# Patient Record
Sex: Male | Born: 1975 | Race: White | Hispanic: No | Marital: Single | Smoking: Never smoker
Health system: Southern US, Community
[De-identification: ages and names within clinical notes are randomized; demographics above are authoritative.]

---

## 2014-10-16 ENCOUNTER — Emergency Department (HOSPITAL_COMMUNITY)
Admission: EM | Admit: 2014-10-16 | Discharge: 2014-10-16 | Disposition: A | Discharge status: Home / Self Care | Payer: BLUE CROSS/BLUE SHIELD | Attending: Emergency Medicine | Admitting: Emergency Medicine

## 2014-10-16 ENCOUNTER — Emergency Department (HOSPITAL_COMMUNITY): Payer: BLUE CROSS/BLUE SHIELD

## 2014-10-16 ENCOUNTER — Encounter (HOSPITAL_COMMUNITY): Payer: Self-pay | Admitting: Emergency Medicine

## 2014-10-16 DIAGNOSIS — S53402A Unspecified sprain of left elbow, initial encounter: Secondary | ICD-10-CM | POA: Diagnosis not present

## 2014-10-16 DIAGNOSIS — Y92091 Bathroom in other non-institutional residence as the place of occurrence of the external cause: Secondary | ICD-10-CM | POA: Diagnosis not present

## 2014-10-16 DIAGNOSIS — S63502A Unspecified sprain of left wrist, initial encounter: Secondary | ICD-10-CM | POA: Diagnosis not present

## 2014-10-16 DIAGNOSIS — Y9389 Activity, other specified: Secondary | ICD-10-CM | POA: Insufficient documentation

## 2014-10-16 DIAGNOSIS — Z79899 Other long term (current) drug therapy: Secondary | ICD-10-CM | POA: Diagnosis not present

## 2014-10-16 DIAGNOSIS — W1839XA Other fall on same level, initial encounter: Secondary | ICD-10-CM | POA: Diagnosis not present

## 2014-10-16 DIAGNOSIS — Y998 Other external cause status: Secondary | ICD-10-CM | POA: Diagnosis not present

## 2014-10-16 DIAGNOSIS — R52 Pain, unspecified: Secondary | ICD-10-CM

## 2014-10-16 DIAGNOSIS — S6992XA Unspecified injury of left wrist, hand and finger(s), initial encounter: Secondary | ICD-10-CM | POA: Diagnosis present

## 2014-10-16 MED ORDER — ONDANSETRON HCL 4 MG PO TABS: 4.0000 mg | ORAL_TABLET | Freq: Four times a day (QID) | ORAL | Status: AC

## 2014-10-16 MED ORDER — MORPHINE SULFATE 4 MG/ML IJ SOLN
4.0000 mg | Freq: Once | INTRAMUSCULAR | Status: AC
  Administered 2014-10-16: 4 mg via INTRAVENOUS
  Filled 2014-10-16: qty 1

## 2014-10-16 MED ORDER — IBUPROFEN 600 MG PO TABS: 600.0000 mg | ORAL_TABLET | Freq: Four times a day (QID) | ORAL | Status: AC | PRN

## 2014-10-16 MED ORDER — ONDANSETRON HCL 4 MG/2ML IJ SOLN
4.0000 mg | Freq: Once | INTRAMUSCULAR | Status: AC
  Administered 2014-10-16: 4 mg via INTRAVENOUS
  Filled 2014-10-16: qty 2

## 2014-10-16 MED ORDER — HYDROCODONE-ACETAMINOPHEN 5-325 MG PO TABS: 1.0000 | ORAL_TABLET | ORAL | Status: AC | PRN

## 2014-10-16 MED ORDER — SODIUM CHLORIDE 0.9 % IV SOLN
INTRAVENOUS | Status: DC
  Administered 2014-10-16: 18:00:00 via INTRAVENOUS

## 2014-10-16 NOTE — ED Notes (Signed)
Pt here with c/o of fall on bathroom floor. Wrist and elbow injury. Pain 10/10.

## 2014-10-16 NOTE — ED Provider Notes (Signed)
CSN: [id number]     Arrival date & time 10/16/14  1712 History   None    Chief Complaint  Patient presents with  . Wrist Pain  . Joint Swelling     (Consider location/radiation/quality/duration/timing/severity/associated sxs/prior Treatment) HPI   Patient presents to the ER with complaints of fall on the bathroom floor. He is complaining of severe elbow and wrist injury to the LEFT side and being unable to move his arm due to it "being deformed and broken". He is RIGHT handed He denies loc, head or neck injury. No injury to skin or obvious deformity.   History reviewed. No pertinent past medical history. History reviewed. No pertinent past surgical history. History reviewed. No pertinent family history. History  Substance Use Topics  . Smoking status: Never Smoker   . Smokeless tobacco: Not on file  . Alcohol Use: No    Review of Systems 10 Systems reviewed and are negative for acute change except as noted in the HPI.    Allergies  Review of patient's allergies indicates not on file.  Home Medications   Prior to Admission medications   Medication Sig Start Date End Date Taking? Authorizing Provider  HYDROcodone-acetaminophen (NORCO/VICODIN) 5-325 MG per tablet Take 1-2 tablets by mouth every 4 (four) hours as needed. 10/16/14   Janira Mandell Irine SealG Atha Mcbain, PA-C  ibuprofen (ADVIL,MOTRIN) 600 MG tablet Take 1 tablet (600 mg total) by mouth every 6 (six) hours as needed. 10/16/14   Mansour Balboa Irine SealG Choua Chalker, PA-C  ondansetron (ZOFRAN) 4 MG tablet Take 1 tablet (4 mg total) by mouth every 6 (six) hours. 10/16/14   Cristal Howatt Irine SealG Simmie Garin, PA-C   BP 140/65 mmHg  Pulse 111  Temp(Src) 98.6 F (37 C) (Oral)  Resp 20  SpO2 99% Physical Exam  Constitutional: He appears well-developed and well-nourished. No distress.  HENT:  Head: Normocephalic and atraumatic.  Eyes: Pupils are equal, round, and reactive to light.  Neck: Normal range of motion. Neck supple. No spinous process tenderness and no muscular  tenderness present.  Cardiovascular: Normal rate and regular rhythm.   Pulmonary/Chest: Effort normal.  Abdominal: Soft.  Musculoskeletal:       Left wrist: He exhibits decreased range of motion, tenderness, bony tenderness and swelling. He exhibits no effusion, no crepitus, no deformity and no laceration.       Left forearm: He exhibits tenderness and bony tenderness. He exhibits no swelling, no edema, no deformity and no laceration.  Neurological: He is alert.  Skin: Skin is warm and dry.  Nursing note and vitals reviewed.   ED Course  Procedures (including critical care time) Labs Review Labs Reviewed - No data to display  Imaging Review Dg Elbow Complete Left  10/16/2014   CLINICAL DATA:  Fall wall in inguinal of shower with left elbow pain, initial encounter  EXAM: LEFT ELBOW - COMPLETE 3+ VIEW  COMPARISON:  None.  FINDINGS: A well corticated bony density is noted adjacent to the medial aspect of the elbow joint which may be related to prior trauma or ligamentous calcification. No acute fracture or dislocation is seen. No joint effusion is noted  IMPRESSION: Chronic changes without acute abnormality.   Electronically Signed   By: Alcide CleverMark  Lukens M.D.   On: 10/16/2014 18:18   Dg Forearm Left  10/16/2014   CLINICAL DATA:  Fall in shower with arm pain, initial encounter  EXAM: LEFT FOREARM - 2 VIEW  COMPARISON:  None.  FINDINGS: Chronic changes about the left elbow joint are seen. No  acute fracture or dislocation is noted. Some mild irregularity of the distal radius is seen which may be related to prior trauma. No acute fracture is seen.  IMPRESSION: No acute abnormality noted.   Electronically Signed   By: Alcide CleverMark  Lukens M.D.   On: 10/16/2014 18:20   Dg Wrist Complete Left  10/16/2014   CLINICAL DATA:  Fall in shower, pain, initial encounter  EXAM: LEFT WRIST - COMPLETE 3+ VIEW  COMPARISON:  None.  FINDINGS: There is no evidence of fracture or dislocation. There is no evidence of  arthropathy or other focal bone abnormality. Soft tissues are unremarkable.  IMPRESSION: No acute abnormality noted.   Electronically Signed   By: Alcide CleverMark  Lukens M.D.   On: 10/16/2014 18:19     MDM   Final diagnoses:  Pain  Wrist sprain, left, initial encounter  Elbow sprain, left, initial encounter    The xray shows no fracture physical exam is not significant but the patient still does not want to move his left arm. Will put in ace wrap and foam arm sling, will refer to HAND specialist. RICE protocol recommended  Patient to see Dr. Darin EngelsWingold in 1-2 weeks.  38 y.o.Adarrius Weigelt's evaluation in the Emergency Department is complete. It has been determined that no acute conditions requiring further emergency intervention are present at this time. The patient/guardian have been advised of the diagnosis and plan. We have discussed signs and symptoms that warrant return to the ED, such as changes or worsening in symptoms.  Vital signs are stable at discharge. Filed Vitals:   10/16/14 1741  BP: 140/65  Pulse: 111  Temp: 98.6 F (37 C)  Resp: 20    Patient/guardian has voiced understanding and agreed to follow-up with the PCP or specialist.       Dorthula Matasiffany G Breaker Springer, PA-C 10/16/14 1856  Linwood DibblesJon Knapp, MD 10/18/14 1036

## 2014-10-16 NOTE — Discharge Instructions (Signed)
Sprain A sprain is a tear in one of the strong, fibrous tissues that connect your bones (ligaments). The severity of the sprain depends on how much of the ligament is torn. The tear can be either partial or complete. CAUSES  Often, sprains are a result of a fall or an injury. The force of the impact causes the fibers of your ligament to stretch beyond their normal length. This excess tension causes the fibers of your ligament to tear. SYMPTOMS  You may have some loss of motion or increased pain within your normal range of motion. Other symptoms include:  Bruising.  Tenderness.  Swelling. DIAGNOSIS  In order to diagnose a sprain, your caregiver will physically examine you to determine how torn the ligament is. Your caregiver may also suggest an X-ray exam to make sure no bones are broken. TREATMENT  If your ligament is only partially torn, treatment usually involves keeping the injured area in a fixed position (immobilization) for a short period. To do this, your caregiver will apply a bandage, cast, or splint to keep the area from moving until it heals. For a partially torn ligament, the healing process usually takes 2 to 3 weeks. If your ligament is completely torn, you may need surgery to reconnect the ligament to the bone or to reconstruct the ligament. After surgery, a cast or splint may be applied and will need to stay on for 4 to 6 weeks while your ligament heals. HOME CARE INSTRUCTIONS  Keep the injured area elevated to decrease swelling.  To ease pain and swelling, apply ice to your joint twice a day, for 2 to 3 days.  Put ice in a plastic bag.  Place a towel between your skin and the bag.  Leave the ice on for 15 minutes.  Only take over-the-counter or prescription medicine for pain as directed by your caregiver.  Do not leave the injured area unprotected until pain and stiffness go away (usually 3 to 4 weeks).  Do not allow your cast or splint to get wet. Cover your cast or  splint with a plastic bag when you shower or bathe. Do not swim.  Your caregiver may suggest exercises for you to do during your recovery to prevent or limit permanent stiffness. SEEK IMMEDIATE MEDICAL CARE IF:  Your cast or splint becomes damaged.  Your pain becomes worse. MAKE SURE YOU:  Understand these instructions.  Will watch your condition.  Will get help right away if you are not doing well or get worse. Document Released: 10/04/2000 Document Revised: 12/30/2011 Document Reviewed: 10/19/2011 [facility] CenterExitCare Patient Information 2015 ReserveExitCare, [place]LLC. This information is not intended to replace advice given to you by your health care provider. Make sure you discuss any questions you have with your health care provider.  Wrist Pain Wrist injuries are frequent in adults and children. A sprain is an injury to the ligaments that hold your bones together. A strain is an injury to muscle or muscle cord-like structures (tendons) from stretching or pulling. Generally, when wrists are moderately tender to touch following a fall or injury, a break in the bone (fracture) may be present. Most wrist sprains or strains are better in 3 to 5 days, but complete healing may take several weeks. HOME CARE INSTRUCTIONS   Put ice on the injured area.  Put ice in a plastic bag.  Place a towel between your skin and the bag.  Leave the ice on for 15-20 minutes, 3-4 times a day, for the first 2  days, or as directed by your health care provider.  Keep your arm raised above the level of your heart whenever possible to reduce swelling and pain.  Rest the injured area for at least 48 hours or as directed by your health care provider.  If a splint or elastic bandage has been applied, use it for as long as directed by your health care provider or until seen by a health care provider for a follow-up exam.  Only take over-the-counter or prescription medicines for pain, discomfort, or fever as directed by your health  care provider.  Keep all follow-up appointments. You may need to follow up with a specialist or have follow-up X-rays. Improvement in pain level is not a guarantee that you did not fracture a bone in your wrist. The only way to determine whether or not you have a broken bone is by X-ray. SEEK IMMEDIATE MEDICAL CARE IF:   Your fingers are swollen, very red, white, or cold and blue.  Your fingers are numb or tingling.  You have increasing pain.  You have difficulty moving your fingers. MAKE SURE YOU: Acetaminophen biphasic or extended-release tablets Qu es este medicamento? El ACETAMINOFENO es un analgsico. Se utiliza para tratar los dolores leves y para Personal assistant fiebre. Este medicamento puede ser utilizado para otros usos; si tiene alguna pregunta consulte con su proveedor de atencin mdica o con su farmacutico. MARCAS COMERCIALES DISPONIBLES: Mapap Arthritis Pain, Tylenol 8 Hour, Tylenol Arthritis Pain Qu le debo informar a mi profesional de la salud antes de tomar este medicamento? Necesita saber si usted presenta alguno de los siguientes problemas o situaciones: -si consume alcohol con frecuencia -enfermedad heptica -una reaccin alrgica o inusual al acetaminofeno, a otros medicamentos, alimentos, colorantes o conservantes -si est embarazada o buscando quedar embarazada -si est amamantando a un beb Cmo debo utilizar este medicamento? Tome este medicamento por va oral con un vaso de agua. Siga las instrucciones de la etiqueta o del envase del medicamento. No corte, triture ni [employer]. Tome sus dosis a intervalos regulares. No tome su medicamento con una frecuencia mayor a la indicada. Hable con su pediatra para informarse acerca del uso de este medicamento en nios. Aunque este medicamento ha sido recetado a nios tan menores como de 12 aos de edad para condiciones selectivas, las precauciones se aplican. Sobredosis: Pngase en contacto inmediatamente con  un centro toxicolgico o una sala de urgencia si usted cree que haya tomado demasiado medicamento. ATENCIN: [employer] es solo para usted. No comparta este medicamento con nadie. Qu sucede si me olvido de una dosis? Si olvida una dosis, tmela lo antes posible. Si es casi la hora de la prxima dosis, tome slo esa dosis. No tome dosis adicionales o dobles. Qu puede interactuar con este medicamento? -alcohol -imatinib -isoniazida -otros medicamentos con acetaminofeno Puede ser que esta lista no menciona todas las posibles interacciones. Informe a su profesional de [employer] de [employer] productos a base de hierbas, medicamentos de [place] o suplementos nutritivos que est tomando. Si usted fuma, consume bebidas alcohlicas o si utiliza drogas ilegales, indqueselo tambin a su profesional de [employer]. Algunas sustancias pueden interactuar con su medicamento. A qu debo estar atento al usar [employer]? Informe a su mdico o su profesional de la salud si el dolor persiste durante ms de 10 das ([address]  [address] para los nios), si empeora o si experimenta un dolor nuevo o de tipo diferente. Adems, consulte con su mdico si la fiebre  persiste durante ms de 3 das. No tome otros medicamentos que contienen acetaminofeno con PPL Corporationeste medicamento. Lea siempre las etiquetas cuidadosamente. Si tiene preguntas, consulte a su mdico o su farmacutico. Si toma demasiado acetaminofeno consigue ayuda mdica enseguida. Tomando mucho acetaminofeno puede ser muy peligroso y causar dao al hgado. Aun si no tiene sntomas, es importante que usted consigas ayuda enseguida. Qu efectos secundarios puedo tener al Boston Scientificutilizar este medicamento? Efectos secundarios que debe informar a su mdico o a Producer, television/film/videosu profesional de la salud tan pronto como sea posible: -Therapist, artreacciones alrgicas como erupcin cutnea, picazn o urticarias, hinchazn de la cara, labios o lengua -problemas respiratorios -fiebre o dolor de  garganta -enrojecimiento, formacin de ampollas, descamacin o distensin de la piel, inclusive dentro de la boca -dificultad para orinar o cambios en el volumen de orina -sangrado o magulladuras inusuales -cansancio o debilidad inusual -color amarillento de ojos o piel Efectos secundarios que, por lo general, no requieren atencin mdica (debe informarlos a su mdico o a su profesional de la salud si persisten o si son molestos): -dolor de cabeza -nuseas, vmito Puede ser que Massachusetts Mutual Lifeesta lista no menciona todos los posibles efectos secundarios. Comunquese a su mdico por asesoramiento mdico Hewlett-Packardsobre los efectos secundarios. Usted puede informar los efectos secundarios a la FDA por telfono al 1-800-FDA-1088. Dnde debo guardar mi medicina? Mantngala fuera del alcance de los nios. Gurdela a Sanmina-SCItemperatura ambiente, entre 20 y 25 grados C ([phone] y 4577 grados F). Protjala de la humedad y del Airline pilotcalor. Deseche todo el medicamento que no haya utilizado, despus de la fecha de vencimiento. ATENCIN: Este folleto es un resumen. Puede ser que no cubra toda la posible informacin. Si usted tiene preguntas acerca de esta medicina, consulte con su mdico, su farmacutico o su profesional de Radiographer, therapeuticla salud.  2015, Elsevier/Gold Standard. (2013-06-16 18:23:19)   Understand these instructions.  Will watch your condition.  Will get help right away if you are not doing well or get worse. Document Released: 07/17/2005 Document Revised: 10/12/2013 Document Reviewed: 11/28/2010 [facility] LLCExitCare Patient Information 2015 ProspectExitCare, MarylandLLC. This information is not intended to replace advice given to you by your health care provider. Make sure you discuss any questions you have with your health care provider.

## 2016-04-30 IMAGING — CR DG WRIST COMPLETE 3+V*L*
4 series · 4 of 4 positions shown · non-contrast
Comparison: None.

CLINICAL DATA: Fall in shower, pain, initial encounter

EXAM:
LEFT WRIST - COMPLETE 3+ VIEW

[x wrist lat left]
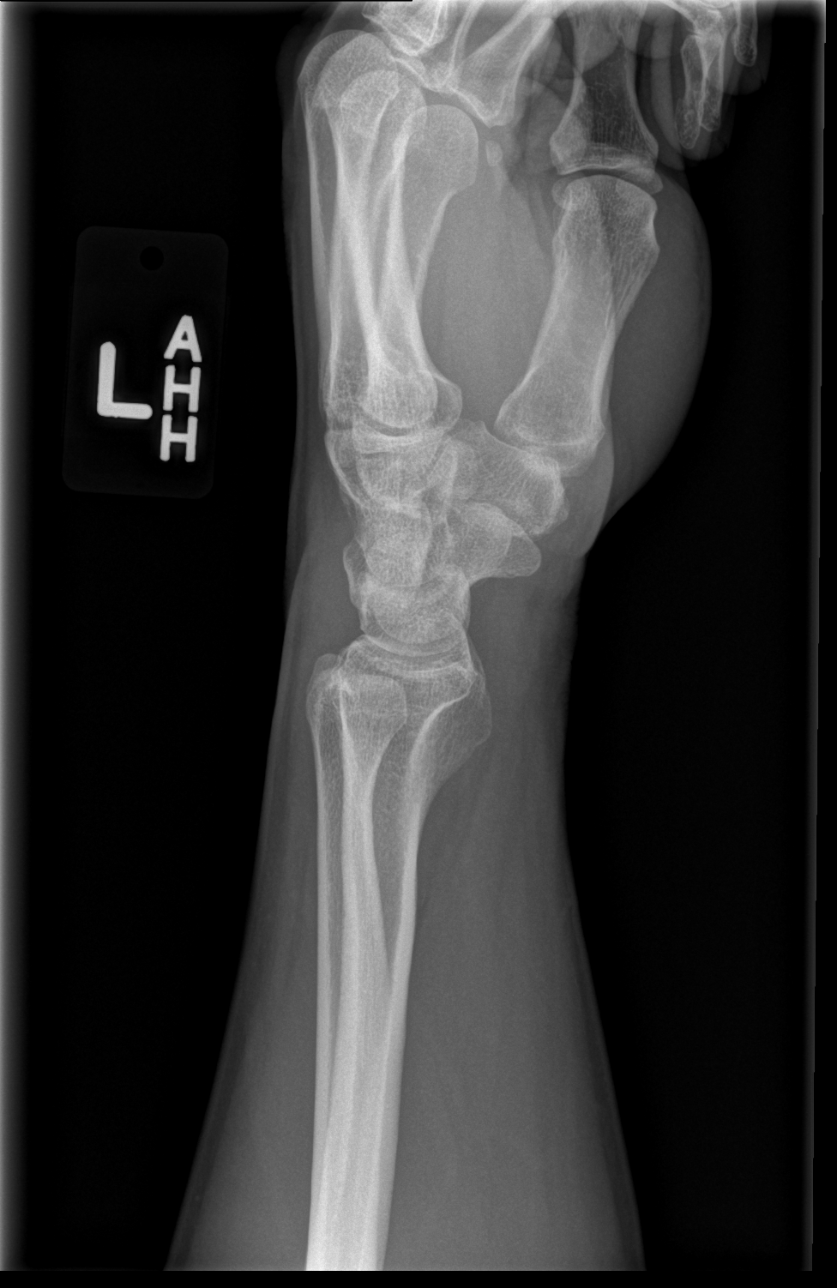

[x wrist obl left]
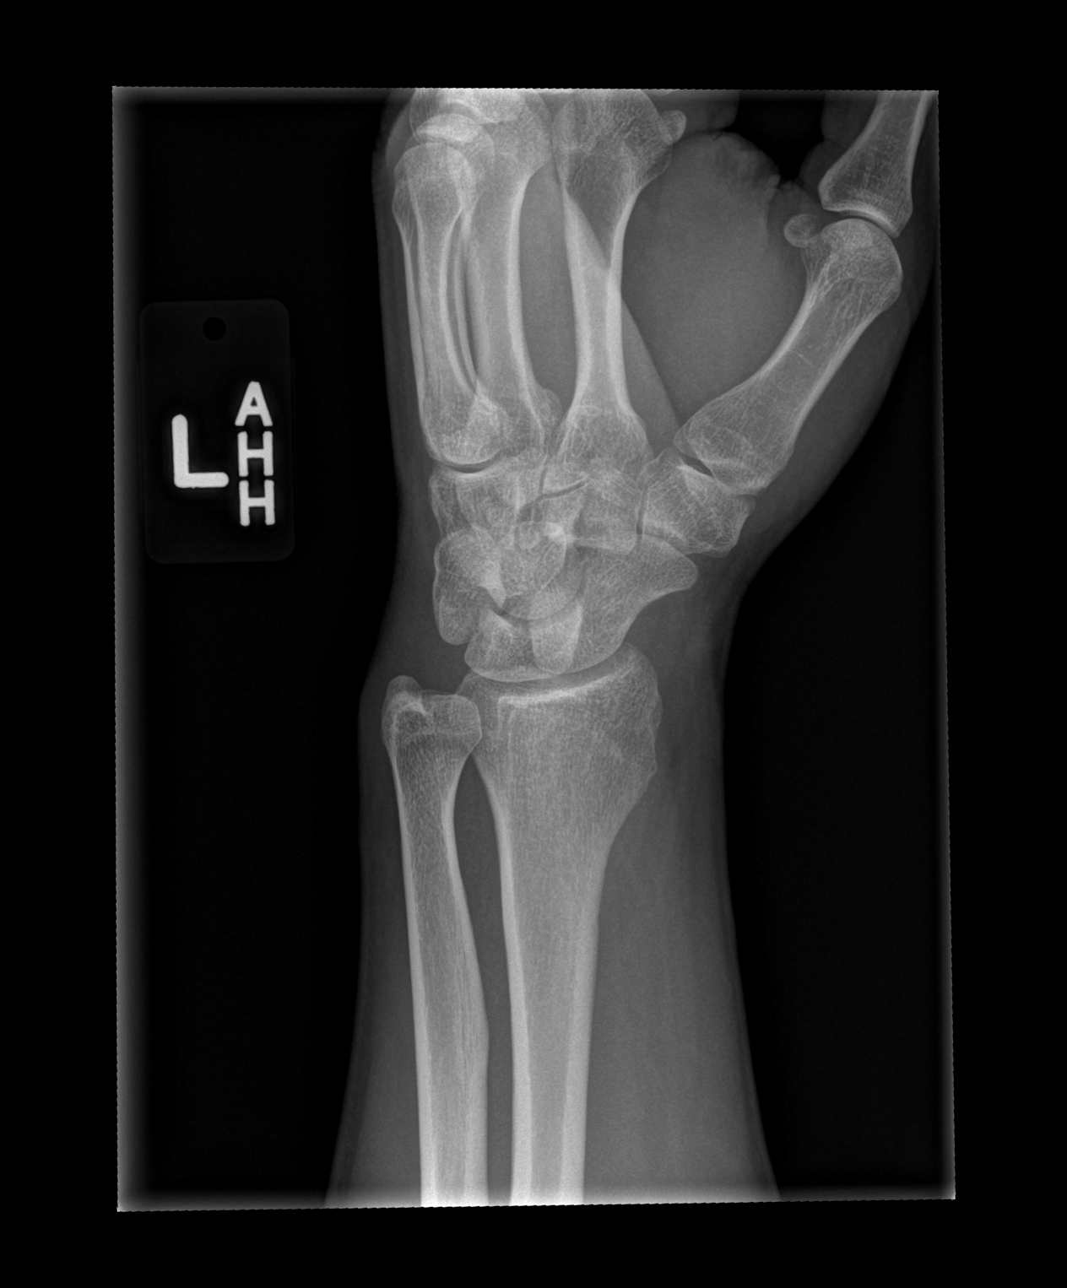

[x wrist navicular view left (1 of 2)]
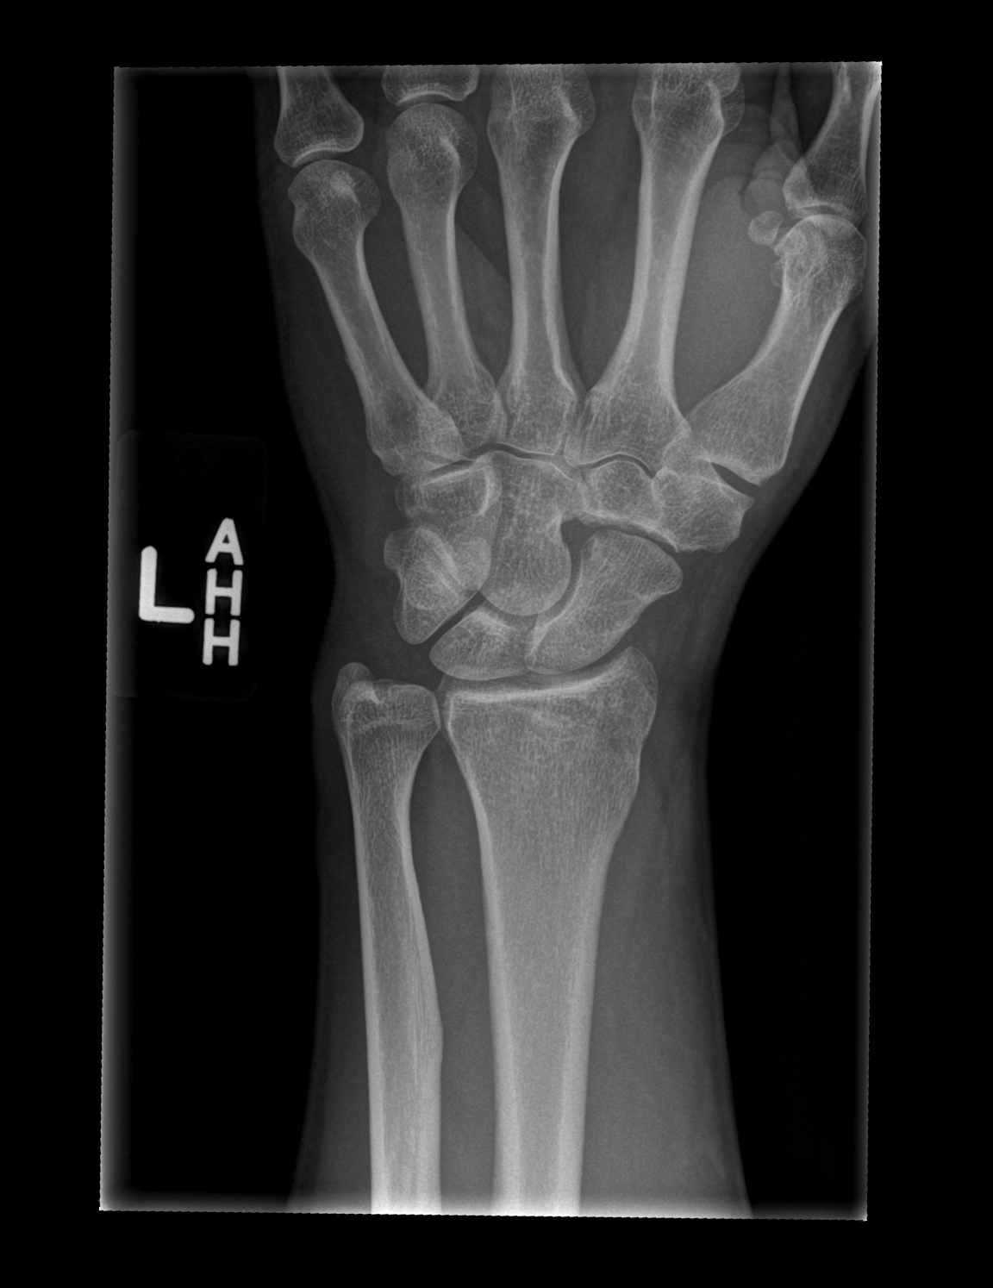

[x wrist navicular view left (2 of 2)]
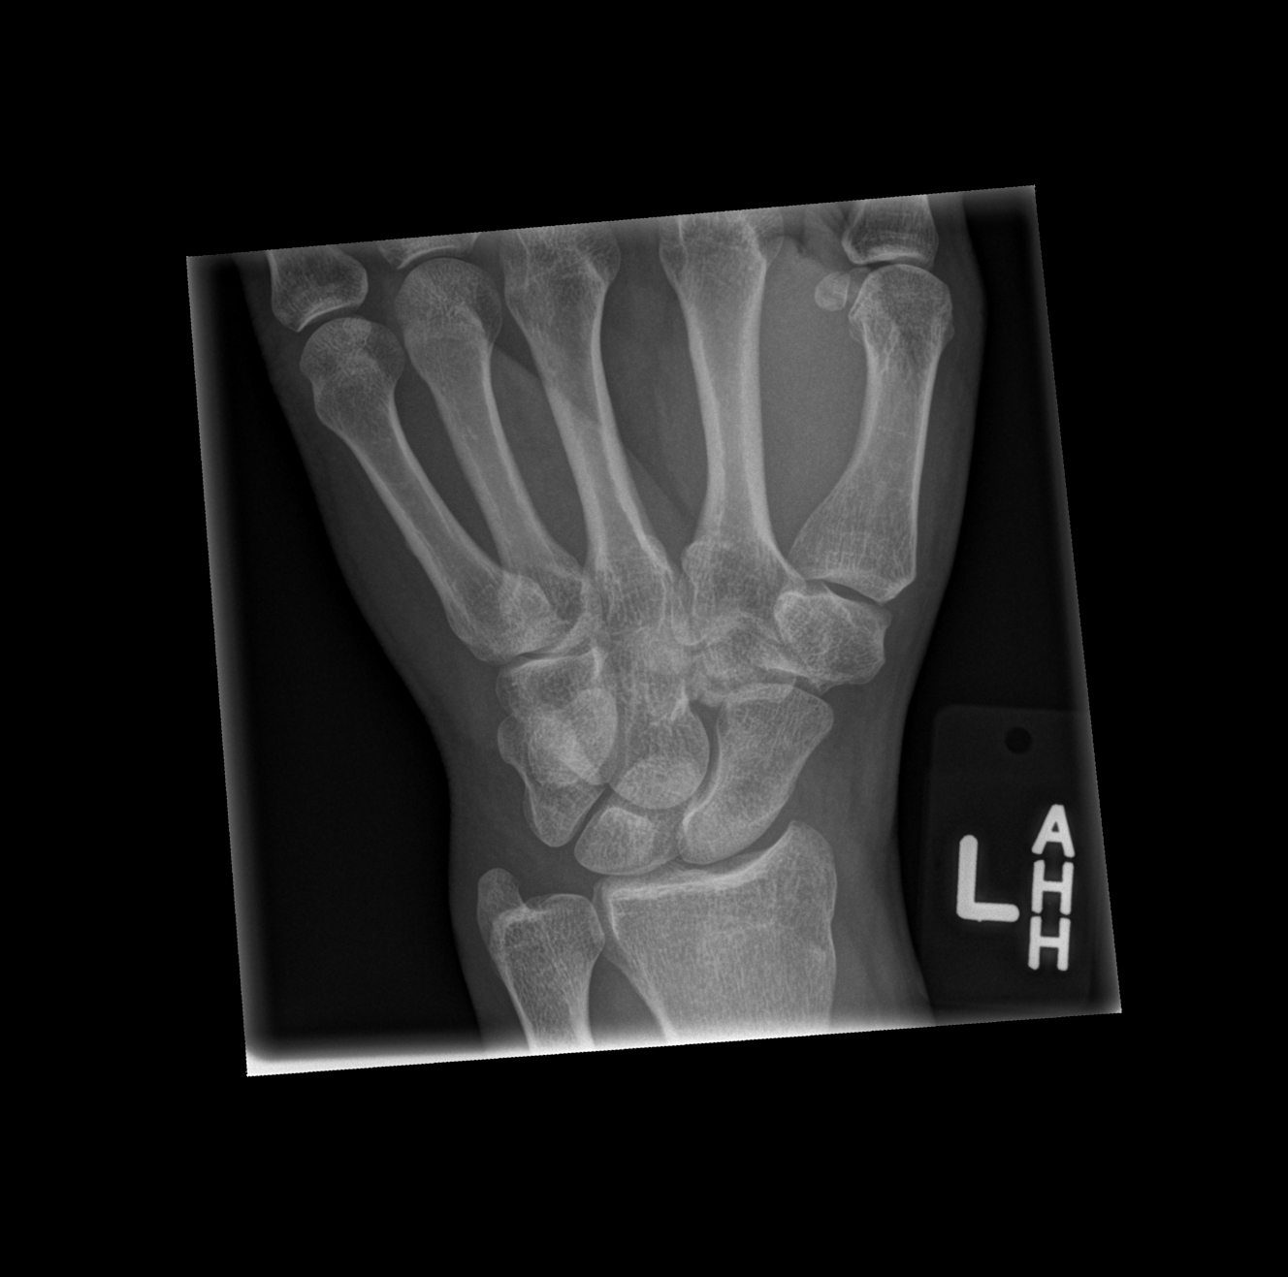

[4 of 4 positions shown; findings below may reference images not displayed]

FINDINGS: There is no evidence of fracture or dislocation. There is no
evidence of arthropathy or other focal bone abnormality. Soft
tissues are unremarkable.
IMPRESSION: No acute abnormality noted.
